# Patient Record
Sex: Female | Born: 1990 | Race: Black or African American | Hispanic: No | Smoking: Never smoker
Health system: Southern US, Community
[De-identification: ages and names within clinical notes are randomized; demographics above are authoritative.]

---

## 2012-06-02 ENCOUNTER — Emergency Department (HOSPITAL_COMMUNITY)
Admission: EM | Admit: 2012-06-02 | Discharge: 2012-06-02 | Disposition: A | Discharge status: Home / Self Care | Payer: Self-pay | Attending: Emergency Medicine | Admitting: Emergency Medicine

## 2012-06-02 ENCOUNTER — Encounter (HOSPITAL_COMMUNITY): Payer: Self-pay | Admitting: *Deleted

## 2012-06-02 DIAGNOSIS — R102 Pelvic and perineal pain: Secondary | ICD-10-CM

## 2012-06-02 DIAGNOSIS — Z3202 Encounter for pregnancy test, result negative: Secondary | ICD-10-CM | POA: Insufficient documentation

## 2012-06-02 DIAGNOSIS — R109 Unspecified abdominal pain: Secondary | ICD-10-CM | POA: Insufficient documentation

## 2012-06-02 LAB — URINALYSIS, ROUTINE W REFLEX MICROSCOPIC
Nitrite: NEGATIVE
Specific Gravity, Urine: 1.026 (ref 1.005–1.030)
Urobilinogen, UA: 0.2 mg/dL (ref 0.0–1.0)

## 2012-06-02 LAB — URINE MICROSCOPIC-ADD ON

## 2012-06-02 LAB — POCT PREGNANCY, URINE: Preg Test, Ur: NEGATIVE

## 2012-06-02 MED ORDER — OXYCODONE-ACETAMINOPHEN 5-325 MG PO TABS: 1.0000 | ORAL_TABLET | ORAL | Status: DC | PRN

## 2012-06-02 MED ORDER — OXYCODONE-ACETAMINOPHEN 5-325 MG PO TABS
2.0000 | ORAL_TABLET | Freq: Once | ORAL | Status: AC
  Administered 2012-06-02: 2 via ORAL
  Filled 2012-06-02: qty 2

## 2012-06-02 MED ORDER — LORAZEPAM 1 MG PO TABS
1.0000 mg | ORAL_TABLET | Freq: Once | ORAL | Status: AC
  Administered 2012-06-02: 1 mg via ORAL
  Filled 2012-06-02: qty 1

## 2012-06-02 MED ORDER — NAPROXEN 500 MG PO TABS: 500.0000 mg | ORAL_TABLET | Freq: Two times a day (BID) | ORAL | Status: DC | PRN

## 2012-06-02 NOTE — ED Notes (Signed)
Abd cramping started 2 hours ago, different from cycle cramps

## 2012-06-02 NOTE — ED Notes (Signed)
Pt reports abdominal cramping that started early this am; pt is menstruating at this time, however sts this is not normal cramping for her. Pt reports moderate nausea this am. Pt sts cramping is intermittent.

## 2012-06-02 NOTE — ED Provider Notes (Signed)
History    22 year old female with abdominal claims. Onset around 7:00 this morning. Patient is currently on her period. Feels like her previous cramping with periods, but stronger than typical. No urinary complaints. No back pain. Pain is in the lower abdomen and does not lateralize. Waxes and wanes but does not completely go away. No appreciable exacerbating relieving factors. No history of abdominal or pelvic surgery. No significant past medical history. Patient took 800 mg of ibuprofen prior to arrival with minimal relief.  CSN: [id number]  Arrival date & time 06/02/12  0847   First MD Initiated Contact with Patient 06/02/12 [phone]      Chief Complaint  Patient presents with  . Abdominal Cramping    (Consider location/radiation/quality/duration/timing/severity/associated sxs/prior treatment) HPI  History reviewed. No pertinent past medical history.  History reviewed. No pertinent past surgical history.  No family history on file.  History  Substance Use Topics  . Smoking status: Never Smoker   . Smokeless tobacco: Not on file  . Alcohol Use: Yes    OB History    Grav Para Term Preterm Abortions TAB SAB Ect Mult Living                  Review of Systems  All systems reviewed and negative, other than as noted in HPI.   Allergies  Zithromax  Home Medications   Current Outpatient Rx  Name  Route  Sig  Dispense  Refill  . BIOTIN 5000 MCG PO CAPS   Oral   Take 1 capsule by mouth 2 (two) times daily.         . OMEGA-3 FATTY ACIDS 1000 MG PO CAPS   Oral   Take 1 g by mouth every morning.         . IBUPROFEN 200 MG PO TABS   Oral   Take 800 mg by mouth every 6 (six) hours as needed. For pain           BP 161/86  Pulse 82  Temp 98.6 F (37 C) (Oral)  Resp 20  SpO2 97%  LMP 05/29/2012  Physical Exam  Nursing note and vitals reviewed. Constitutional: She appears well-developed and well-nourished. No distress.  HENT:  Head: Normocephalic and  atraumatic.  Eyes: Conjunctivae normal are normal. Right eye exhibits no discharge. Left eye exhibits no discharge.  Neck: Neck supple.  Cardiovascular: Normal rate, regular rhythm and normal heart sounds.  Exam reveals no gallop and no friction rub.   No murmur heard. Pulmonary/Chest: Effort normal and breath sounds normal. No respiratory distress.  Abdominal: Soft. She exhibits no distension. There is tenderness.       Mild suprapubic tenderness w/o rebound or guarding  Genitourinary:       No cva tenderness  Musculoskeletal: She exhibits no edema and no tenderness.  Neurological: She is alert.  Skin: Skin is warm and dry.  Psychiatric: She has a normal mood and affect. Her behavior is normal. Thought content normal.    ED Course  Procedures (including critical care time)  Labs Reviewed  URINALYSIS, ROUTINE W REFLEX MICROSCOPIC - Abnormal; Notable for the following:    APPearance CLOUDY (*)     Hgb urine dipstick LARGE (*)     All other components within normal limits  POCT PREGNANCY, URINE  URINE MICROSCOPIC-ADD ON  LAB REPORT - SCANNED   No results found.   1. Pelvic cramping       MDM  21yF with cramps. Suspect related to menstruation.  Only mild tenderness on exam. Afebrile and well appearing. Better with meds. Not pregnant and UA not particularly suggestive of UTI. Feel safe for DC. Emergent precautions discussed.         Raeford Razor, MD 06/06/12 [phone]

## 2012-06-02 NOTE — ED Notes (Signed)
Pt unable to void at this time. 

## 2012-06-25 ENCOUNTER — Emergency Department (HOSPITAL_COMMUNITY): Payer: Self-pay

## 2012-06-25 ENCOUNTER — Encounter (HOSPITAL_COMMUNITY): Payer: Self-pay | Admitting: Emergency Medicine

## 2012-06-25 ENCOUNTER — Emergency Department (HOSPITAL_COMMUNITY)
Admission: EM | Admit: 2012-06-25 | Discharge: 2012-06-25 | Disposition: A | Discharge status: Home / Self Care | Payer: Self-pay | Attending: Emergency Medicine | Admitting: Emergency Medicine

## 2012-06-25 DIAGNOSIS — R059 Cough, unspecified: Secondary | ICD-10-CM | POA: Insufficient documentation

## 2012-06-25 DIAGNOSIS — Z79899 Other long term (current) drug therapy: Secondary | ICD-10-CM | POA: Insufficient documentation

## 2012-06-25 DIAGNOSIS — IMO0001 Reserved for inherently not codable concepts without codable children: Secondary | ICD-10-CM | POA: Insufficient documentation

## 2012-06-25 DIAGNOSIS — R05 Cough: Secondary | ICD-10-CM | POA: Insufficient documentation

## 2012-06-25 DIAGNOSIS — R509 Fever, unspecified: Secondary | ICD-10-CM | POA: Insufficient documentation

## 2012-06-25 DIAGNOSIS — R110 Nausea: Secondary | ICD-10-CM | POA: Insufficient documentation

## 2012-06-25 MED ORDER — ONDANSETRON 4 MG PO TBDP
4.0000 mg | ORAL_TABLET | Freq: Once | ORAL | Status: AC
  Administered 2012-06-25: 4 mg via ORAL
  Filled 2012-06-25: qty 1

## 2012-06-25 MED ORDER — ACETAMINOPHEN 325 MG PO TABS
650.0000 mg | ORAL_TABLET | Freq: Once | ORAL | Status: AC
  Administered 2012-06-25: 650 mg via ORAL
  Filled 2012-06-25: qty 2

## 2012-06-25 MED ORDER — HYDROCODONE-ACETAMINOPHEN 7.5-325 MG/15ML PO SOLN: 15.0000 mL | Freq: Four times a day (QID) | ORAL | Status: DC | PRN

## 2012-06-25 MED ORDER — PROMETHAZINE HCL 25 MG PO TABS: 25.0000 mg | ORAL_TABLET | Freq: Four times a day (QID) | ORAL | Status: DC | PRN

## 2012-06-25 MED ORDER — HYDROCOD POLST-CHLORPHEN POLST 10-8 MG/5ML PO LQCR
5.0000 mL | Freq: Once | ORAL | Status: AC
  Administered 2012-06-25: 5 mL via ORAL
  Filled 2012-06-25: qty 5

## 2012-06-25 NOTE — ED Notes (Signed)
States that she started feeling weak with body aches yesterday. States that she has had a cough for a while.

## 2012-06-25 NOTE — ED Provider Notes (Signed)
History     CSN: [id number]  Arrival date & time 06/25/12  1258   First MD Initiated Contact with Patient 06/25/12 1341      Chief Complaint  Patient presents with  . Fever    (Consider location/radiation/quality/duration/timing/severity/associated sxs/prior treatment) HPI  Margarit Minshall is a 22 y.o. female complaining of myalgia, nausea no vomiting, fever with maximum recorded temperature of 102 yesterday associated with cough that was productive and is now dry and resolve pharyngitis. Patient denies sick contacts, abdominal pain, change in bowel or bladder habits, shortness of breath, chest pain, headache, meningismus. Patient has not had a flu shot this year.   History reviewed. No pertinent past medical history.  History reviewed. No pertinent past surgical history.  No family history on file.  History  Substance Use Topics  . Smoking status: Never Smoker   . Smokeless tobacco: Not on file  . Alcohol Use: Yes    OB History   Grav Para Term Preterm Abortions TAB SAB Ect Mult Living                  Review of Systems  Constitutional: Positive for fever.  Respiratory: Positive for cough. Negative for shortness of breath.   Cardiovascular: Negative for chest pain.  Gastrointestinal: Positive for nausea. Negative for vomiting, abdominal pain and diarrhea.  Musculoskeletal: Positive for myalgias.  All other systems reviewed and are negative.    Allergies  Zithromax  Home Medications   Current Outpatient Rx  Name  Route  Sig  Dispense  Refill  . Biotin 5000 MCG CAPS   Oral   Take 2 capsules by mouth daily.          . fish oil-omega-3 fatty acids 1000 MG capsule   Oral   Take 1 g by mouth every morning.         Marland Kitchen ibuprofen (ADVIL,MOTRIN) 200 MG tablet   Oral   Take 400-800 mg by mouth every 6 (six) hours as needed for pain. For pain           BP 136/69  Pulse 120  Temp(Src) 101.8 F (38.8 C) (Oral)  Resp 19  SpO2 100%  LMP  05/29/2012  Physical Exam  Nursing note and vitals reviewed. Constitutional: She is oriented to person, place, and time. She appears well-developed and well-nourished. No distress.  Appears acutely uncomfortable  HENT:  Head: Normocephalic.  Right Ear: External ear normal.  Left Ear: External ear normal.  Mouth/Throat: Oropharynx is clear and moist.  Eyes: Conjunctivae and EOM are normal. Pupils are equal, round, and reactive to light.  Neck: Normal range of motion. Neck supple.  Cardiovascular: Regular rhythm, normal heart sounds and intact distal pulses.   Tachy  Pulmonary/Chest: Effort normal and breath sounds normal. No stridor. No respiratory distress. She has no wheezes. She has no rales.  Abdominal: Soft. There is no tenderness. There is no rebound and no guarding.  Musculoskeletal: Normal range of motion.  Neurological: She is alert and oriented to person, place, and time.  Psychiatric: She has a normal mood and affect.    ED Course  Procedures (including critical care time)  Labs Reviewed - No data to display Dg Chest 2 View  06/25/2012  *RADIOLOGY REPORT*  Clinical Data: Cough, fever  CHEST - 2 VIEW  Comparison: None.  Findings: Cardiomediastinal silhouette is unremarkable.  No acute infiltrate or pleural effusion.  No pulmonary edema.  IMPRESSION: No active disease.   Original Report Authenticated By: Lang Snow  Pop, M.D.      1. Influenza-like illness       MDM  Patient appears acutely uncomfortable. Symptoms consistent with influenza. Chest x-ray ordered to rule out complication of pneumonia.  Explained to the patient that she has SIRS and is pre-septic. I advised her that although she is young and healthy influenza can be fatal. Advised both her and her friend to have a very low threshold to return to the emergency room for any worsening of symptoms or concerning symptoms.   Pt verbalized understanding and agrees with care plan. Outpatient follow-up and return  precautions given.    New Prescriptions   HYDROCODONE-ACETAMINOPHEN (HYCET) 7.5-325 MG/15 ML SOLUTION    Take 15 mLs by mouth every 6 (six) hours as needed for pain or cough.   PROMETHAZINE (PHENERGAN) 25 MG TABLET    Take 1 tablet (25 mg total) by mouth every 6 (six) hours as needed for nausea.        Wynetta Emery, PA-C 06/25/12 1426  Madelyne Millikan, PA-C 06/25/12 1427

## 2012-06-25 NOTE — ED Provider Notes (Signed)
Medical screening examination/treatment/procedure(s) were performed by non-physician practitioner and as supervising physician I was immediately available for consultation/collaboration.    Janet Decesare R Elantra Caprara, MD 06/25/12 1628 

## 2014-05-03 ENCOUNTER — Encounter (HOSPITAL_COMMUNITY): Payer: Self-pay | Admitting: Emergency Medicine

## 2014-05-03 ENCOUNTER — Emergency Department (HOSPITAL_COMMUNITY)
Admission: EM | Admit: 2014-05-03 | Discharge: 2014-05-03 | Disposition: A | Discharge status: Home / Self Care | Payer: BC Managed Care – PPO | Attending: Emergency Medicine | Admitting: Emergency Medicine

## 2014-05-03 DIAGNOSIS — R05 Cough: Secondary | ICD-10-CM | POA: Insufficient documentation

## 2014-05-03 DIAGNOSIS — R509 Fever, unspecified: Secondary | ICD-10-CM | POA: Insufficient documentation

## 2014-05-03 DIAGNOSIS — Z79899 Other long term (current) drug therapy: Secondary | ICD-10-CM | POA: Diagnosis not present

## 2014-05-03 DIAGNOSIS — H6121 Impacted cerumen, right ear: Secondary | ICD-10-CM

## 2014-05-03 DIAGNOSIS — H9201 Otalgia, right ear: Secondary | ICD-10-CM | POA: Diagnosis present

## 2014-05-03 MED ORDER — AMOXICILLIN 500 MG PO CAPS: 500.0000 mg | ORAL_CAPSULE | Freq: Three times a day (TID) | ORAL | Status: DC

## 2014-05-03 MED ORDER — LIDOCAINE VISCOUS 2 % MT SOLN
15.0000 mL | Freq: Once | OROMUCOSAL | Status: AC
  Administered 2014-05-03: 15 mL via OROMUCOSAL
  Filled 2014-05-03: qty 15

## 2014-05-03 MED ORDER — ACETAMINOPHEN 325 MG PO TABS
650.0000 mg | ORAL_TABLET | Freq: Four times a day (QID) | ORAL | Status: DC | PRN
Start: 2014-05-03 — End: 2014-05-03
  Administered 2014-05-03: 650 mg via ORAL
  Filled 2014-05-03: qty 2

## 2014-05-03 NOTE — ED Notes (Signed)
Pt arrived to the ED with a complaint of a right sided ear ache.  Pt has had a cold for several days and today developed aright sided ear pain that has progressed in intensity.

## 2014-05-03 NOTE — ED Provider Notes (Signed)
CSN: [id number]     Arrival date & time 05/03/14  0343 History   First MD Initiated Contact with Patient 05/03/14 [phone]     Chief Complaint  Patient presents with  . Otalgia      HPI Patient presents complaining of right-sided earache over the past several days.  She states cough without sore throat.  No shortness of breath.  No neck pain or neck swelling.  No rash.  Patient reports subjective fever at home.  Patient has a history of intermittent cerumen impactions.  She states "eardrops" replacement her ear this morning by a friend.  She states increasing pain since then.  She does not know the details of these eardrops.   History reviewed. No pertinent past medical history. History reviewed. No pertinent past surgical history. History reviewed. No pertinent family history. History  Substance Use Topics  . Smoking status: Never Smoker   . Smokeless tobacco: Not on file  . Alcohol Use: Yes   OB History    No data available     Review of Systems  All other systems reviewed and are negative.     Allergies  Zithromax  Home Medications   Prior to Admission medications   Medication Sig Start Date End Date Taking? Authorizing Provider  acetaminophen (TYLENOL) 325 MG tablet Take 650 mg by mouth every 6 (six) hours as needed for moderate pain.   Yes Historical Provider, MD  guaiFENesin (MUCINEX) 600 MG 12 hr tablet Take 600 mg by mouth 2 (two) times daily as needed for cough.   Yes Historical Provider, MD  ibuprofen (ADVIL,MOTRIN) 200 MG tablet Take 400-800 mg by mouth every 6 (six) hours as needed for pain. For pain   Yes Historical Provider, MD  Pseudoeph-Doxylamine-DM-APAP (NYQUIL D COLD/FLU PO) Take 15 mLs by mouth 2 (two) times daily.   Yes Historical Provider, MD         Biotin 5000 MCG CAPS Take 2 capsules by mouth daily.     Historical Provider, MD  fish oil-omega-3 fatty acids 1000 MG capsule Take 1 g by mouth every morning.    Historical Provider, MD   HYDROcodone-acetaminophen (HYCET) 7.5-325 mg/15 ml solution Take 15 mLs by mouth every 6 (six) hours as needed for pain or cough. Patient not taking: Reported on 05/03/2014 06/25/12   Joni ReiningNicole Pisciotta, PA-C  promethazine (PHENERGAN) 25 MG tablet Take 1 tablet (25 mg total) by mouth every 6 (six) hours as needed for nausea. Patient not taking: Reported on 05/03/2014 06/25/12   Joni ReiningNicole Pisciotta, PA-C   BP 126/67 mmHg  Pulse 107  Temp(Src) 102.4 F (39.1 C) (Oral)  Resp 16  Ht 5\' 5"  (1.651 m)  Wt 123 lb (55.792 kg)  BMI 20.47 kg/m2  SpO2 100%  LMP 05/03/2014 (Exact Date) Physical Exam  Constitutional: She is oriented to person, place, and time. She appears well-developed and well-nourished.  HENT:  Head: Normocephalic.  Left TM is normal.  Right TM initially obscured by cerumen impaction.  Cerumen was removed and there is erythema of the TM without obvious perforation.  No signs of otitis externa.  No tenderness of the right mastoid.  Posterior pharynx is normal without tonsillar exudate.  Dentition is without acute abnormality  Eyes: EOM are normal.  Neck: Normal range of motion.  Pulmonary/Chest: Effort normal.  Abdominal: She exhibits no distension.  Musculoskeletal: Normal range of motion.  Neurological: She is alert and oriented to person, place, and time.  Psychiatric: She has a normal mood and  affect.  Nursing note and vitals reviewed.   ED Course  Procedures (including critical care time) Labs Review Labs Reviewed - No data to display  Imaging Review No results found.   EKG Interpretation None      MDM   Final diagnoses:  Otalgia, right  Cerumen impaction, right  Fever, unspecified fever cause    Cerumen impaction with likely developing right otitis media.  Cerumen removed by nursing staff.  Patient is feeling better after cerumen impaction.  Given her fever and her erythematous TM she'll be started on amoxicillin.    Lyanne CoKevin M Harvey Lingo, MD 05/03/14 [phone]

## 2014-05-03 NOTE — Discharge Instructions (Signed)
Cerumen Impaction °A cerumen impaction is when the wax in your ear forms a plug. This plug usually causes reduced hearing. Sometimes it also causes an earache or dizziness. Removing a cerumen impaction can be difficult and painful. The wax sticks to the ear canal. The canal is sensitive and bleeds easily. If you try to remove a heavy wax buildup with a cotton tipped swab, you may push it in further. °Irrigation with water, suction, and small ear curettes may be used to clear out the wax. If the impaction is fixed to the skin in the ear canal, ear drops may be needed for a few days to loosen the wax. People who build up a lot of wax frequently can use ear wax removal products available in your local drugstore. °SEEK MEDICAL CARE IF:  °You develop an earache, increased hearing loss, or marked dizziness. °Document Released: 06/01/2004 Document Revised: 07/17/2011 Document Reviewed: 07/22/2009 °[employer]® Patient Information ©2015 [employer]. This information is not intended to replace advice given to you by your health care provider. Make sure you discuss any questions you have with your health care provider. ° °

## 2014-05-03 NOTE — ED Notes (Signed)
Irrigated R ear with warm saline which returned a large amount of dark brown pieces. Pt tolerated procedure well. Dr Patria Maneampos at bedside to complete wax removal

## 2014-11-02 ENCOUNTER — Encounter (HOSPITAL_COMMUNITY): Payer: Self-pay

## 2014-11-02 ENCOUNTER — Emergency Department (HOSPITAL_COMMUNITY)
Admission: EM | Admit: 2014-11-02 | Discharge: 2014-11-02 | Disposition: A | Discharge status: Home / Self Care | Payer: BLUE CROSS/BLUE SHIELD | Attending: Emergency Medicine | Admitting: Emergency Medicine

## 2014-11-02 DIAGNOSIS — J0100 Acute maxillary sinusitis, unspecified: Secondary | ICD-10-CM | POA: Insufficient documentation

## 2014-11-02 DIAGNOSIS — H9209 Otalgia, unspecified ear: Secondary | ICD-10-CM | POA: Insufficient documentation

## 2014-11-02 DIAGNOSIS — R0981 Nasal congestion: Secondary | ICD-10-CM | POA: Diagnosis present

## 2014-11-02 DIAGNOSIS — Z79899 Other long term (current) drug therapy: Secondary | ICD-10-CM | POA: Diagnosis not present

## 2014-11-02 DIAGNOSIS — R000 Tachycardia, unspecified: Secondary | ICD-10-CM | POA: Diagnosis not present

## 2014-11-02 MED ORDER — GUAIFENESIN ER 600 MG PO TB12: 600.0000 mg | ORAL_TABLET | Freq: Two times a day (BID) | ORAL | Status: DC | PRN

## 2014-11-02 MED ORDER — AMOXICILLIN-POT CLAVULANATE 875-125 MG PO TABS: 1.0000 | ORAL_TABLET | Freq: Two times a day (BID) | ORAL | Status: DC

## 2014-11-02 NOTE — ED Notes (Addendum)
Pt c/o of cold like symptoms and ear pain since last Saturday with no improvement. Denies  N/V/D

## 2014-11-02 NOTE — ED Provider Notes (Signed)
CSN: [id number]     Arrival date & time 11/02/14  1522 History  This chart was scribed for non-physician practitioner Fayrene Helper, PA-C working with Mancel Bale, MD by Murriel Hopper, ED Scribe. This patient was seen in room WTR5/WTR5 and the patient's care was started at 3:31 PM.    Chief Complaint  Patient presents with  . Nasal Congestion  . Otalgia      The history is provided by the patient. No language interpreter was used.     HPI Comments: Michelle Cortez is a 24 y.o. female who presents to the Emergency Department complaining of intermittent worsening fever up to 101 with associated ear pain, sore throat, sneezing, coughing that has been present for the past week. Sts she has had sinus congestion and sinus pain with greenish nasal discharge.  Report sinus headache.  Pt states she has not been able to work because of her symptoms and notes that she was sent home because of her symptoms today. Pt notes she has used Catering manager and tylenol with no relief. 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 Denies vomiting nausea diarrhea.    No past medical history on file. No past surgical history on file. No family history on file. History  Substance Use Topics  . Smoking status: Never Smoker   . Smokeless tobacco: Not on file  . Alcohol Use: Yes   OB History    No data available     Review of Systems  HENT: Positive for ear pain, sneezing and sore throat.   Respiratory: Positive for cough.   Gastrointestinal: Negative for nausea, vomiting and diarrhea.      Allergies  Zithromax  Home Medications   Prior to Admission medications   Medication Sig Start Date End Date Taking? Authorizing Provider  acetaminophen (TYLENOL) 325 MG tablet Take 650 mg by mouth every 6 (six) hours as needed for moderate pain.    Historical Provider, MD  amoxicillin (AMOXIL) 500 MG capsule Take 1 capsule (500 mg total) by mouth 3 (three) times daily. 05/03/14   Azalia Bilis,  MD  Biotin 5000 MCG CAPS Take 2 capsules by mouth daily.     Historical Provider, MD  fish oil-omega-3 fatty acids 1000 MG capsule Take 1 g by mouth every morning.    Historical Provider, MD  guaiFENesin (MUCINEX) 600 MG 12 hr tablet Take 600 mg by mouth 2 (two) times daily as needed for cough.    Historical Provider, MD  HYDROcodone-acetaminophen (HYCET) 7.5-325 mg/15 ml solution Take 15 mLs by mouth every 6 (six) hours as needed for pain or cough. Patient not taking: Reported on 05/03/2014 06/25/12   Joni Reining Pisciotta, PA-C  ibuprofen (ADVIL,MOTRIN) 200 MG tablet Take 400-800 mg by mouth every 6 (six) hours as needed for pain. For pain    Historical Provider, MD  promethazine (PHENERGAN) 25 MG tablet Take 1 tablet (25 mg total) by mouth every 6 (six) hours as needed for nausea. Patient not taking: Reported on 05/03/2014 06/25/12   Joni Reining Pisciotta, PA-C  Pseudoeph-Doxylamine-DM-APAP (NYQUIL D COLD/FLU PO) Take 15 mLs by mouth 2 (two) times daily.    Historical Provider, MD   There were no vitals taken for this visit. Physical Exam  Constitutional: She is oriented to person, place, and time. She appears well-developed and well-nourished.  HENT:  Head: Normocephalic and atraumatic.  Throat:  Rhinorrhea  Uvula midline No tonsil enlargement or exudate Green mucous  Lymphadenopathy No nuchal rigidity  Cardiovascular: Normal rate.  Exam reveals no gallop and no friction rub.   No murmur heard. Tachycardic   Pulmonary/Chest: Effort normal and breath sounds normal. She has no wheezes. She has no rales. She exhibits no tenderness.  Abdominal: Soft. She exhibits no distension. There is no tenderness.  Neurological: She is alert and oriented to person, place, and time.  Skin: Skin is warm and dry.  Psychiatric: She has a normal mood and affect.  Nursing note and vitals reviewed.   ED Course  Procedures (including critical care time)  DIAGNOSTIC STUDIES: Oxygen Saturation is 100% on room  air, normal by my interpretation.    COORDINATION OF CARE: 3:35 PM Discussed treatment plan with pt at bedside and pt agreed to plan. Pt will be treated for sinusitis and prescribed antibiotics.   Patient presents with symptoms suggestive of upper respiratory infection. However she also complaining of sinus headache, sinus congestion and having greenish discharge for more than a week.   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  She has potential to develop sinusitis.  Will prescribe augmentin and recommend pt to continue using OTC medication for sxs treatment.    Labs Review Labs Reviewed - No data to display  Imaging Review No results found.   EKG Interpretation None      MDM   Final diagnoses:  Acute maxillary sinusitis, recurrence not specified    BP 122/76 mmHg  Pulse 106  Temp(Src) 98.5 F (36.9 C) (Oral)  Resp 18  Ht  (1.651 m)  Wt 127 lb (57.607 kg)  BMI 21.13 kg/m2  SpO2 100%   I personally performed the services described in this documentation, which was scribed in my presence. The recorded information has been reviewed and is accurate.     Fayrene Helper, PA-C 11/02/14 1544  Mancel Bale, MD 11/02/14 [phone]

## 2014-11-02 NOTE — Discharge Instructions (Signed)

## 2014-11-02 NOTE — ED Notes (Signed)
edpa bowie present at bedside.

## 2014-12-06 ENCOUNTER — Emergency Department (INDEPENDENT_AMBULATORY_CARE_PROVIDER_SITE_OTHER)
Admission: EM | Admit: 2014-12-06 | Discharge: 2014-12-06 | Disposition: A | Discharge status: Home / Self Care | Payer: Self-pay | Source: Home / Self Care | Attending: Family Medicine | Admitting: Family Medicine

## 2014-12-06 ENCOUNTER — Encounter (HOSPITAL_COMMUNITY): Payer: Self-pay | Admitting: Emergency Medicine

## 2014-12-06 DIAGNOSIS — R51 Headache: Secondary | ICD-10-CM

## 2014-12-06 DIAGNOSIS — R519 Headache, unspecified: Secondary | ICD-10-CM

## 2014-12-06 MED ORDER — IBUPROFEN 400 MG PO TABS: 400.0000 mg | ORAL_TABLET | Freq: Four times a day (QID) | ORAL | Status: DC | PRN
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
## 2014-12-06 NOTE — ED Notes (Signed)
Accessed record for patient phone call .  Looking for wallet, no wallet found
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
## 2015-03-12 ENCOUNTER — Encounter (HOSPITAL_COMMUNITY): Payer: Self-pay | Admitting: Emergency Medicine

## 2015-03-12 ENCOUNTER — Emergency Department (HOSPITAL_COMMUNITY)
Admission: EM | Admit: 2015-03-12 | Discharge: 2015-03-12 | Discharge status: Against Medical Advice | Payer: BLUE CROSS/BLUE SHIELD | Attending: Emergency Medicine | Admitting: Emergency Medicine

## 2015-03-12 DIAGNOSIS — N946 Dysmenorrhea, unspecified: Secondary | ICD-10-CM | POA: Diagnosis not present

## 2015-03-12 DIAGNOSIS — R1110 Vomiting, unspecified: Secondary | ICD-10-CM | POA: Diagnosis present

## 2015-03-12 NOTE — ED Notes (Signed)
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

## 2015-03-12 NOTE — ED Provider Notes (Signed)
CSN: [id number]     Arrival date & time 03/12/15  0751 History   First MD Initiated Contact with Patient 03/12/15 [phone]     Chief Complaint  Patient presents with  . cramps/vomiting      (Consider location/radiation/quality/duration/timing/severity/associated sxs/prior Treatment) Patient is a 24 y.o. female presenting with abdominal pain. The history is provided by the patient (Patient complains of uterine cramps today. She has a history of moderate to severe menses. Patient usually takes Motrin for this.).  Abdominal Pain Pain location:  Suprapubic Pain quality: aching   Pain radiates to:  Does not radiate Pain severity:  Mild Onset quality:  Sudden Timing:  Intermittent Progression:  Waxing and waning Associated symptoms: no chest pain, no cough, no diarrhea, no fatigue and no hematuria     History reviewed. No pertinent past medical history. History reviewed. No pertinent past surgical history. No family history on file. Social History  Substance Use Topics  . Smoking status: Never Smoker   . Smokeless tobacco: None  . Alcohol Use: Yes   OB History    No data available     Review of Systems  Constitutional: Negative for appetite change and fatigue.  HENT: Negative for congestion, ear discharge and sinus pressure.   Eyes: Negative for discharge.  Respiratory: Negative for cough.   Cardiovascular: Negative for chest pain.  Gastrointestinal: Positive for abdominal pain. Negative for diarrhea.  Genitourinary: Negative for frequency and hematuria.       Uterine cramps  Musculoskeletal: Negative for back pain.  Skin: Negative for rash.  Neurological: Negative for seizures and headaches.  Psychiatric/Behavioral: Negative for hallucinations.      Allergies  Zithromax  Home Medications   Prior to Admission medications   Medication Sig Start Date End Date Taking? Authorizing Provider  acetaminophen (TYLENOL) 325 MG tablet Take 650 mg by mouth every 6 (six) hours as  needed for moderate pain.   Yes Historical Provider, MD  ibuprofen (ADVIL,MOTRIN) 400 MG tablet Take 1 tablet (400 mg total) by mouth every 6 (six) hours as needed. Patient taking differently: Take 400 mg by mouth every 6 (six) hours as needed for fever, headache, mild pain or moderate pain.  12/06/14  Yes Doreene Eland, MD  amoxicillin-clavulanate (AUGMENTIN) 875-125 MG per tablet Take 1 tablet by mouth 2 (two) times daily. One po bid x 7 days Patient not taking: Reported on 12/06/2014 11/02/14   Fayrene Helper, PA-C  guaiFENesin (MUCINEX) 600 MG 12 hr tablet Take 1 tablet (600 mg total) by mouth 2 (two) times daily as needed for cough or to loosen phlegm. Patient not taking: Reported on 03/12/2015 11/02/14   Fayrene Helper, PA-C  HYDROcodone-acetaminophen (HYCET) 7.5-325 mg/15 ml solution Take 15 mLs by mouth every 6 (six) hours as needed for pain or cough. Patient not taking: Reported on 05/03/2014 06/25/12   Joni Reining Pisciotta, PA-C  promethazine (PHENERGAN) 25 MG tablet Take 1 tablet (25 mg total) by mouth every 6 (six) hours as needed for nausea. Patient not taking: Reported on 05/03/2014 06/25/12   Joni Reining Pisciotta, PA-C   BP 136/74 mmHg  Pulse 98  Temp(Src) 98.1 F (36.7 C) (Oral)  Resp 16  Ht  (1.651 m)  Wt 133 lb (60.328 kg)  BMI 22.13 kg/m2  SpO2 100%  LMP 03/12/2015 Physical Exam  Constitutional: She is oriented to person, place, and time. She appears well-developed.  HENT:  Head: Normocephalic.  Eyes: Conjunctivae and EOM are normal. No scleral icterus.  Neck: Neck supple.  No thyromegaly present.  Cardiovascular: Normal rate and regular rhythm.  Exam reveals no gallop and no friction rub.   No murmur heard. Pulmonary/Chest: No stridor. She has no wheezes. She has no rales. She exhibits no tenderness.  Abdominal: She exhibits no distension. There is no tenderness. There is no rebound.  Musculoskeletal: Normal range of motion. She exhibits no edema.  Lymphadenopathy:    She has no  cervical adenopathy.  Neurological: She is oriented to person, place, and time. She exhibits normal muscle tone. Coordination normal.  Skin: No rash noted. No erythema.  Psychiatric: She has a normal mood and affect. Her behavior is normal.    ED Course  Procedures (including critical care time) Labs Review Labs Reviewed  CBC WITH DIFFERENTIAL/PLATELET  COMPREHENSIVE METABOLIC PANEL  URINALYSIS, ROUTINE W REFLEX MICROSCOPIC (NOT [facility])  PREGNANCY, URINE    Imaging Review No results found. I have personally reviewed and evaluated these images and lab results as part of my medical decision-making.   EKG Interpretation None      MDM   Final diagnoses:  None    Patient left before any labs were drawn. Suspect symptoms were related to menses.    Bethann BerkshireJoseph Deyanira Fesler, MD 03/12/15 [phone]

## 2015-03-12 NOTE — ED Notes (Signed)
Per pt, states menstrual cramps with pain and vomiting since 5 am-states she has history of the same-took 800 mg of Ibuprofen with no relief

## 2015-03-12 NOTE — ED Notes (Signed)
Patient walked out of emergency room stating that she had to go to work. Estell HarpinZammit, MD notified.

## 2016-07-07 ENCOUNTER — Encounter (HOSPITAL_COMMUNITY): Payer: Self-pay | Admitting: Emergency Medicine

## 2016-07-07 DIAGNOSIS — R112 Nausea with vomiting, unspecified: Secondary | ICD-10-CM | POA: Insufficient documentation

## 2016-07-07 DIAGNOSIS — Z5321 Procedure and treatment not carried out due to patient leaving prior to being seen by health care provider: Secondary | ICD-10-CM | POA: Insufficient documentation

## 2016-07-07 MED ORDER — ONDANSETRON 4 MG PO TBDP
4.0000 mg | ORAL_TABLET | Freq: Once | ORAL | Status: AC | PRN
  Administered 2016-07-07: 4 mg via ORAL
  Filled 2016-07-07: qty 1

## 2016-07-07 NOTE — ED Notes (Signed)
Pt refused blood work at this time.

## 2016-07-07 NOTE — ED Triage Notes (Signed)
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

## 2016-07-08 ENCOUNTER — Emergency Department (HOSPITAL_COMMUNITY)
Admission: EM | Admit: 2016-07-08 | Discharge: 2016-07-08 | Disposition: A | Discharge status: Home / Self Care | Payer: BLUE CROSS/BLUE SHIELD | Attending: Emergency Medicine | Admitting: Emergency Medicine

## 2016-07-08 NOTE — ED Notes (Signed)
Called patient to a room and no answer. 

## 2016-07-08 NOTE — ED Notes (Signed)
Called patient to room and no answer. 

## 2016-07-08 NOTE — ED Notes (Signed)
Pt called in waiting room no answer 

## 2017-07-27 ENCOUNTER — Emergency Department (HOSPITAL_COMMUNITY)
Admission: EM | Admit: 2017-07-27 | Discharge: 2017-07-27 | Disposition: A | Discharge status: Home / Self Care | Payer: BLUE CROSS/BLUE SHIELD | Attending: Emergency Medicine | Admitting: Emergency Medicine

## 2017-07-27 ENCOUNTER — Other Ambulatory Visit: Payer: Self-pay

## 2017-07-27 ENCOUNTER — Encounter (HOSPITAL_COMMUNITY): Payer: Self-pay | Admitting: Emergency Medicine

## 2017-07-27 DIAGNOSIS — R102 Pelvic and perineal pain: Secondary | ICD-10-CM | POA: Insufficient documentation

## 2017-07-27 LAB — PREGNANCY, URINE: Preg Test, Ur: NEGATIVE

## 2017-07-27 LAB — I-STAT CHEM 8, ED
BUN: 7 mg/dL (ref 6–20)
CALCIUM ION: 1.19 mmol/L (ref 1.15–1.40)
CHLORIDE: 105 mmol/L (ref 101–111)
Creatinine, Ser: 0.7 mg/dL (ref 0.44–1.00)
Glucose, Bld: 94 mg/dL (ref 65–99)
HCT: 40 % (ref 36.0–46.0)
Hemoglobin: 13.6 g/dL (ref 12.0–15.0)
POTASSIUM: 4.5 mmol/L (ref 3.5–5.1)
SODIUM: 141 mmol/L (ref 135–145)
TCO2: 24 mmol/L (ref 22–32)

## 2017-07-27 LAB — URINALYSIS, ROUTINE W REFLEX MICROSCOPIC
BILIRUBIN URINE: NEGATIVE
Glucose, UA: NEGATIVE mg/dL
Ketones, ur: NEGATIVE mg/dL
LEUKOCYTES UA: NEGATIVE
NITRITE: NEGATIVE
Protein, ur: NEGATIVE mg/dL
SPECIFIC GRAVITY, URINE: 1.017 (ref 1.005–1.030)
pH: 5 (ref 5.0–8.0)

## 2017-07-27 MED ORDER — NAPROXEN 375 MG PO TABS: 375.0000 mg | ORAL_TABLET | Freq: Two times a day (BID) | ORAL | 0 refills | Status: AC

## 2017-07-27 MED ORDER — KETOROLAC TROMETHAMINE 30 MG/ML IJ SOLN
30.0000 mg | Freq: Once | INTRAMUSCULAR | Status: AC
  Administered 2017-07-27: 30 mg via INTRAMUSCULAR
  Filled 2017-07-27: qty 1

## 2017-07-27 NOTE — ED Provider Notes (Signed)
[facility]-EMERGENCY DEPT Provider Note   CSN: [id number] Arrival date & time: 07/27/17  0607     History   Chief Complaint Chief Complaint  Patient presents with  . Menstrual Cramps    HPI Michelle Cortez is a 27 y.o. female.  HPI  Patient is a 27 year old female with a history of heavy menses presenting for lower abdominal cramping patient reports that she started menses yesterday, and her.  Lower abdominal pain is cramping in nature and consistent with her prior episodes of menstrual cramping.  Patient reports that it is the same in quality as her prior episodes of cramping, however it is worse this time.  Patient denies any abnormal vaginal discharge, dysuria, urgency, or frequency.  Patient denies any concerns about pregnancy or attempt to become pregnant.  Patient does not use contraception.  Patient reports she is in a monogamous sexual relationship with her husband.  Patient has any fevers, chills.  Patient reports she is nauseous at times with her cramping, but no emesis.  No diarrhea.  Last vomit yesterday and normal for patient.  Patient denies flank pain.  Lightheadedness, shortness of breath, or chest pain.  Patient has tried Tylenol with minimal relief.  Patient is reporting that on presentation in the emergency department after taking Tylenol, she does feel better at this time.  History reviewed. No pertinent past medical history.  There are no active problems to display for this patient.   History reviewed. No pertinent surgical history.  OB History   None      Home Medications    Prior to Admission medications   Medication Sig Start Date End Date Taking? Authorizing Provider  acetaminophen (TYLENOL) 325 MG tablet Take 975 mg by mouth every 6 (six) hours as needed for moderate pain.    Yes [provider]  ibuprofen (ADVIL,MOTRIN) 400 MG tablet Take 1 tablet (400 mg total) by mouth every 6 (six) hours as needed. Patient taking  differently: Take 400 mg by mouth every 6 (six) hours as needed for fever, headache, mild pain or moderate pain.  12/06/14  Yes Doreene Eland, MD  guaiFENesin (MUCINEX) 600 MG 12 hr tablet Take 1 tablet (600 mg total) by mouth 2 (two) times daily as needed for cough or to loosen phlegm. Patient not taking: Reported on 03/12/2015 11/02/14   Fayrene Helper, PA-C  HYDROcodone-acetaminophen (HYCET) 7.5-325 mg/15 ml solution Take 15 mLs by mouth every 6 (six) hours as needed for pain or cough. Patient not taking: Reported on 05/03/2014 06/25/12   Pisciotta, Joni Reining, PA-C    Family History No family history on file.  Social History Social History   Tobacco Use  . Smoking status: Never Smoker  . Smokeless tobacco: Never Used  Substance Use Topics  . Alcohol use: No  . Drug use: No     Allergies   Zithromax [azithromycin]   Review of Systems Review of Systems  Constitutional: Negative for chills and fever.  Respiratory: Negative for cough and shortness of breath.   Cardiovascular: Negative for chest pain.  Gastrointestinal: Negative for abdominal pain, nausea and vomiting.  Genitourinary: Positive for menstrual problem and pelvic pain. Negative for dysuria, flank pain and vaginal discharge.  Musculoskeletal: Negative for back pain and myalgias.  Neurological: Negative for light-headedness.  All other systems reviewed and are negative.    Physical Exam Updated Vital Signs BP 125/77 (BP Location: Left Arm)   Pulse 78   Temp 98.6 F (37 C) (Oral)  Resp 16   LMP 07/27/2017   SpO2 100%   Physical Exam  Constitutional: She appears well-developed and well-nourished. No distress.  HENT:  Head: Normocephalic and atraumatic.  Mouth/Throat: Oropharynx is clear and moist.  Eyes: Pupils are equal, round, and reactive to light. Conjunctivae and EOM are normal.  Neck: Normal range of motion. Neck supple.  Cardiovascular: Normal rate, regular rhythm, S1 normal and S2 normal.  No murmur  heard. Pulmonary/Chest: Effort normal and breath sounds normal. She has no wheezes. She has no rales.  Abdominal: Soft. She exhibits no distension. There is tenderness. There is no guarding.  Mild tenderness over suprapubic region.  Genitourinary:  Genitourinary Comments: Examination performed with RN chaperone, Morrie Sheldonshley, present.  No external lesions of the vagina or perineum.  No inguinal lymphadenopathy.  No lesions of the vaginal canal.  Cervix without friability or erythema.  No polyps of the cervix. Bimanual exam demonstrates mild uterine fundus tenderness, but no masses or adnexal tenderness.  No cervical motion tenderness.  Musculoskeletal: Normal range of motion. She exhibits no edema or deformity.  Lymphadenopathy:    She has no cervical adenopathy.  Neurological: She is alert.  Cranial nerves grossly intact. Patient moves extremities symmetrically and with good coordination.  Skin: Skin is warm and dry. No rash noted. No erythema.  Psychiatric: She has a normal mood and affect. Her behavior is normal. Judgment and thought content normal.  Nursing note and vitals reviewed.    ED Treatments / Results  Labs (all labs ordered are listed, but only abnormal results are displayed) Labs Reviewed  URINALYSIS, ROUTINE W REFLEX MICROSCOPIC - Abnormal; Notable for the following components:      Result Value   Color, Urine STRAW (*)    Hgb urine dipstick LARGE (*)    Bacteria, UA RARE (*)    Squamous Epithelial / LPF 0-5 (*)    All other components within normal limits  PREGNANCY, URINE  I-STAT CHEM 8, ED    EKG  EKG Interpretation None       Radiology No results found.  Procedures Procedures (including critical care time)  Medications Ordered in ED Medications  ketorolac (TORADOL) 30 MG/ML injection 30 mg (has no administration in time range)     Initial Impression / Assessment and Plan / ED Course  I have reviewed the triage vital signs and the nursing  notes.  Pertinent labs & imaging results that were available during my care of the patient were reviewed by me and considered in my medical decision making (see chart for details).  Clinical Course as of Jul 28 926  Fri Jul 27, 2017  0927 Patient continues to be symptomatic of cramping.  Return precautions given for any fever or chills with abdominal pain, unusual vaginal discharge, worsening pelvic pain, or intractable nausea or vomiting.   [AM]    Clinical Course User Index [AM] Elisha PonderMurray, Zhyon Antenucci B, PA-C    Patient is nontoxic-appearing, afebrile, and in no acute distress.  Patient presenting with pelvic cramping that patient reports is consistent with all prior episodes of menstrual cramping, but worse in nature.  Patient feeling better at this time.  Will check basic labs for hemoglobin as well as urinalysis, urine pregnancy, and pelvic exam.  Recommend follow-up with family medicine and gynecology.  Urinalysis significant for large hemoglobin, and patient is currently menstruating.   No evidence of infection. Urine pregnancy negative. Patient deferred any STI testing today.   Hemoglobin is normal and i-STAT Chem-8 without further  abnormality.  Abdomen is benign, and pelvic exam without abnormality.  Recommend naproxen 3 days before menses, and continued into the first couple days of menses.  Patient directed to follow-up.    Final Clinical Impressions(s) / ED Diagnoses   Final diagnoses:  Pelvic pain    ED Discharge Orders        Ordered    naproxen (NAPROSYN) 375 MG tablet  2 times daily     07/27/17 0930       Delia Chimes 07/27/17 0933    Benjiman Core, MD 07/27/17 1711

## 2017-07-27 NOTE — ED Triage Notes (Addendum)
Pt arriving with severe menstrual cramps. Pt states she normally has painful periods, but today is worse. Pt took 1000mg  Tylenol prior to arrival

## 2017-07-27 NOTE — Discharge Instructions (Addendum)
Please see the information and instructions below regarding your visit.  Your diagnoses today include:  1. Pelvic pain     Tests performed today include: See side panel of your discharge paperwork for testing performed today. Vital signs are listed at the bottom of these instructions.   Urine pregnancy is negative, no signs of infection in the urine, and her hemoglobin is normal.  Medications prescribed:    Take any prescribed medications only as prescribed, and any over the counter medications only as directed on the packaging.  You are prescribed Naproxen, a non-steroidal anti-inflammatory agent (NSAID) for pain. You may take 375 mg every 12 hours as needed for pain. If still requiring this medication around the clock for acute pain after 10 days, please see your primary healthcare provider.  Women who are pregnant, breastfeeding, or planning on becoming pregnant should not take non-steroidal anti-inflammatories such as   You may combine this medication with Tylenol, 650 mg every 6 hours, so you are receiving something for pain every 3 hours.  This is not a long-term medication unless under the care and direction of your primary provider. Taking this medication long-term and not under the supervision of a healthcare provider could increase the risk of stomach ulcers, kidney problems, and cardiovascular problems such as high blood pressure.    Home care instructions:  Please follow any educational materials contained in this packet.   Follow-up instructions: Please follow-up with your primary care provider as soon as possible for further evaluation of your symptoms if they are not completely improved.   [facility] with [facility] as needed for further management.  Return instructions:  Please return to the Emergency Department if you experience worsening symptoms.  Please return for any worsening pelvic pain, unusual vaginal discharge with pelvic pain,  fever or chills, or nausea or vomiting that prevents you from keeping anything down. Please also return if you have menses that are progressively heavier and associated with shortness of breath, chest pain, dizziness upon standing. Please return if you have any other emergent concerns.  Additional Information:   Your vital signs today were: BP 125/77 (BP Location: Left Arm)    Pulse 78    Temp 98.6 F (37 C) (Oral)    Resp 16    LMP 07/27/2017    SpO2 100%  If your blood pressure (BP) was elevated on multiple readings during this visit above 130 for the top number or above 80 for the bottom number, please have this repeated by your primary care provider within one month. --------------  Thank you for allowing us to participate in your care today.

## 2017-07-27 NOTE — ED Notes (Signed)
EDPA Provider at bedside. 

## 2017-10-19 ENCOUNTER — Emergency Department (HOSPITAL_COMMUNITY)
Admission: EM | Admit: 2017-10-19 | Discharge: 2017-10-19 | Discharge status: Against Medical Advice | Payer: Self-pay | Attending: Emergency Medicine | Admitting: Emergency Medicine

## 2017-10-19 ENCOUNTER — Emergency Department (HOSPITAL_COMMUNITY): Payer: Self-pay

## 2017-10-19 ENCOUNTER — Encounter (HOSPITAL_COMMUNITY): Payer: Self-pay

## 2017-10-19 ENCOUNTER — Other Ambulatory Visit: Payer: Self-pay

## 2017-10-19 DIAGNOSIS — O209 Hemorrhage in early pregnancy, unspecified: Secondary | ICD-10-CM | POA: Insufficient documentation

## 2017-10-19 DIAGNOSIS — Z79899 Other long term (current) drug therapy: Secondary | ICD-10-CM | POA: Insufficient documentation

## 2017-10-19 DIAGNOSIS — N939 Abnormal uterine and vaginal bleeding, unspecified: Secondary | ICD-10-CM

## 2017-10-19 DIAGNOSIS — Z3A01 Less than 8 weeks gestation of pregnancy: Secondary | ICD-10-CM | POA: Insufficient documentation

## 2017-10-19 DIAGNOSIS — Z3201 Encounter for pregnancy test, result positive: Secondary | ICD-10-CM | POA: Insufficient documentation

## 2017-10-19 LAB — CBC WITH DIFFERENTIAL/PLATELET
BASOS ABS: 0.1 10*3/uL (ref 0.0–0.1)
Basophils Relative: 1 %
EOS PCT: 1 %
Eosinophils Absolute: 0.1 10*3/uL (ref 0.0–0.7)
HCT: 38.7 % (ref 36.0–46.0)
Hemoglobin: 12.6 g/dL (ref 12.0–15.0)
LYMPHS PCT: 26 %
Lymphs Abs: 2.1 10*3/uL (ref 0.7–4.0)
MCH: 27.3 pg (ref 26.0–34.0)
MCHC: 32.6 g/dL (ref 30.0–36.0)
MCV: 83.8 fL (ref 78.0–100.0)
MONO ABS: 0.5 10*3/uL (ref 0.1–1.0)
Monocytes Relative: 6 %
Neutro Abs: 5.5 10*3/uL (ref 1.7–7.7)
Neutrophils Relative %: 66 %
PLATELETS: 378 10*3/uL (ref 150–400)
RBC: 4.62 MIL/uL (ref 3.87–5.11)
RDW: 15.2 % (ref 11.5–15.5)
WBC: 8.2 10*3/uL (ref 4.0–10.5)

## 2017-10-19 LAB — ABO/RH: ABO/RH(D): O POS

## 2017-10-19 LAB — COMPREHENSIVE METABOLIC PANEL
ALT: 13 U/L — ABNORMAL LOW (ref 14–54)
ANION GAP: 8 (ref 5–15)
AST: 13 U/L — ABNORMAL LOW (ref 15–41)
Albumin: 4.1 g/dL (ref 3.5–5.0)
Alkaline Phosphatase: 43 U/L (ref 38–126)
BUN: 10 mg/dL (ref 6–20)
CHLORIDE: 107 mmol/L (ref 101–111)
CO2: 24 mmol/L (ref 22–32)
Calcium: 9 mg/dL (ref 8.9–10.3)
Creatinine, Ser: 0.57 mg/dL (ref 0.44–1.00)
Glucose, Bld: 88 mg/dL (ref 65–99)
POTASSIUM: 4.1 mmol/L (ref 3.5–5.1)
Sodium: 139 mmol/L (ref 135–145)
Total Bilirubin: 0.4 mg/dL (ref 0.3–1.2)
Total Protein: 7.6 g/dL (ref 6.5–8.1)

## 2017-10-19 LAB — WET PREP, GENITAL
SPERM: NONE SEEN
Trich, Wet Prep: NONE SEEN
Yeast Wet Prep HPF POC: NONE SEEN

## 2017-10-19 LAB — URINALYSIS, ROUTINE W REFLEX MICROSCOPIC
BILIRUBIN URINE: NEGATIVE
Bacteria, UA: NONE SEEN
GLUCOSE, UA: NEGATIVE mg/dL
Ketones, ur: NEGATIVE mg/dL
LEUKOCYTES UA: NEGATIVE
NITRITE: NEGATIVE
PH: 7 (ref 5.0–8.0)
Protein, ur: NEGATIVE mg/dL
Specific Gravity, Urine: 1.005 (ref 1.005–1.030)

## 2017-10-19 LAB — PREGNANCY, URINE: PREG TEST UR: POSITIVE — AB

## 2017-10-19 LAB — HCG, QUANTITATIVE, PREGNANCY: HCG, BETA CHAIN, QUANT, S: 26186 m[IU]/mL — AB (ref <5)

## 2017-10-19 NOTE — ED Triage Notes (Signed)
Patient c/o small amount of vaginal bleeding since last night, but more today. Patient is [redacted] weeks pregnant. Patient denies any unusual or different discomforts.

## 2017-10-19 NOTE — ED Notes (Signed)
Bed: WA04 Expected date:  Expected time:  Means of arrival:  Comments: 

## 2017-10-19 NOTE — ED Notes (Signed)
Went to round on the pt, she was not in the assigned room, I called her and she indicated that she had left after her US exam stating that she "was not pleased that her husband was asked to leave after the exam." I expressed this to the attending provider for a possible elopement.

## 2017-10-19 NOTE — ED Provider Notes (Signed)
[facility]-EMERGENCY DEPT Provider Note   CSN: [id number] Arrival date & time: 10/19/17  1028     History   Chief Complaint Chief Complaint  Patient presents with  . Vaginal Bleeding    [redacted] weeks pregnant    HPI Michelle Cortez is a 27 y.o. female who presents for cc of vaginal bleeding.  Patient is G1, P0. She is estimated at [redacted]w[redacted]d. EDD of 06/21/2017.  Patient states that she was at work today when she noticed some blood coming from her vagina.  She states that since that time she has had some spotting.  She denies abdominal pain or cramping, fevers or chills.  She is here with her husband at bedside.  HPI  History reviewed. No pertinent past medical history.  There are no active problems to display for this patient.   History reviewed. No pertinent surgical history.   OB History    Gravida  1   Para      Term      Preterm      AB      Living        SAB      TAB      Ectopic      Multiple      Live Births               Home Medications    Prior to Admission medications   Medication Sig Start Date End Date Taking? Authorizing Provider  naproxen (NAPROSYN) 375 MG tablet Take 1 tablet (375 mg total) by mouth 2 (two) times daily. Take 2 times daily 3 days before menses begins, and 2 days into your menses. 07/27/17  Yes Aviva Kluver B, PA-C  Prenatal Vit-Fe Fumarate-FA (PRENATAL MULTIVITAMIN) TABS tablet Take 1 tablet by mouth daily at 12 noon.   Yes [provider]  acetaminophen (TYLENOL) 325 MG tablet Take 975 mg by mouth every 6 (six) hours as needed for moderate pain.     [provider]  guaiFENesin (MUCINEX) 600 MG 12 hr tablet Take 1 tablet (600 mg total) by mouth 2 (two) times daily as needed for cough or to loosen phlegm. Patient not taking: Reported on 03/12/2015 11/02/14   Fayrene Helper, PA-C  HYDROcodone-acetaminophen (HYCET) 7.5-325 mg/15 ml solution Take 15 mLs by mouth every 6 (six) hours as needed for pain  or cough. Patient not taking: Reported on 05/03/2014 06/25/12   Pisciotta, Joni Reining, PA-C  ibuprofen (ADVIL,MOTRIN) 400 MG tablet Take 1 tablet (400 mg total) by mouth every 6 (six) hours as needed. Patient not taking: Reported on 10/19/2017 12/06/14   Doreene Eland, MD    Family History History reviewed. No pertinent family history.  Social History Social History   Tobacco Use  . Smoking status: Never Smoker  . Smokeless tobacco: Never Used  Substance Use Topics  . Alcohol use: No  . Drug use: No     Allergies   Zithromax [azithromycin]   Review of Systems Review of Systems Ten systems reviewed and are negative for acute change, except as noted in the HPI.    Physical Exam Updated Vital Signs BP 139/90 (BP Location: Right Arm)   Pulse 88   Temp 98.5 F (36.9 C) (Oral)   Resp 18   Ht 5\' 5"  (1.651 m)   Wt 66.7 kg (147 lb)   LMP 07/27/2017   SpO2 100%   BMI 24.46 kg/m   Physical Exam  Constitutional: She is oriented to person,  place, and time. She appears well-developed and well-nourished. No distress.  HENT:  Head: Normocephalic and atraumatic.  Eyes: Conjunctivae are normal. No scleral icterus.  Neck: Normal range of motion.  Cardiovascular: Normal rate, regular rhythm and normal heart sounds. Exam reveals no gallop and no friction rub.  No murmur heard. Pulmonary/Chest: Effort normal and breath sounds normal. No respiratory distress.  Abdominal: Soft. Bowel sounds are normal. She exhibits no distension and no mass. There is no tenderness. There is no guarding.  Genitourinary:  Genitourinary Comments: Patient with normal external female genitalia, Normal vaginal canal without discharge.  Nulliparous cervical office with Chadwick's sign, minimal bloodstained mucosal discharge.  Cervical loss is closed, no CMT or adnexal tenderness or fullness.  Neurological: She is alert and oriented to person, place, and time.  Skin: Skin is warm and dry. She is not  diaphoretic.  Psychiatric: Her behavior is normal.  Nursing note and vitals reviewed.    ED Treatments / Results  Labs (all labs ordered are listed, but only abnormal results are displayed) Labs Reviewed  COMPREHENSIVE METABOLIC PANEL - Abnormal; Notable for the following components:      Result Value   AST 13 (*)    ALT 13 (*)    All other components within normal limits  URINALYSIS, ROUTINE W REFLEX MICROSCOPIC - Abnormal; Notable for the following components:   Hgb urine dipstick MODERATE (*)    All other components within normal limits  WET PREP, GENITAL  CBC WITH DIFFERENTIAL/PLATELET  HCG, QUANTITATIVE, PREGNANCY  HIV ANTIBODY (ROUTINE TESTING)  RPR  PREGNANCY, URINE  I-STAT BETA HCG BLOOD, ED ([facility], [facility], [facility] ONLY)  ABO/RH  GC/CHLAMYDIA PROBE AMP ([place]) NOT AT [facility]    EKG None  Radiology No results found.  Procedures Procedures (including critical care time)  Medications Ordered in ED Medications - No data to display   Initial Impression / Assessment and Plan / ED Course  I have reviewed the triage vital signs and the nursing notes.  Pertinent labs & imaging results that were available during my care of the patient were reviewed by me and considered in my medical decision making (see chart for details).  Clinical Course as of Oct 20 1318  Fri Oct 19, 2017  1306 ABO/RH(D): O POS Performed at [facility], [address] [place], [place] [address]  [AH]    Clinical Course User Index [AH] Arthor CaptainHarris, Khianna Blazina, PA-C    Patient Eloped from the ED apparently because the US tech told her husband he could not be in the room during during the ultrasound.  Nurse Dairl Ponderscar Njihi try to contact the patient and transfer the line however patient hung up the phone.  There are no emergent findings on her work-up.  Final Clinical Impressions(s) / ED Diagnoses   Final diagnoses:  None    ED Discharge Orders    None       Arthor CaptainHarris, Gerad Cornelio,  PA-C 10/19/17 1645    Donnetta Hutchingook, Brian, MD 10/20/17 [phone]

## 2017-10-20 LAB — GC/CHLAMYDIA PROBE AMP (~~LOC~~) NOT AT ARMC
Chlamydia: POSITIVE — AB
Neisseria Gonorrhea: POSITIVE — AB

## 2017-10-20 LAB — HIV ANTIBODY (ROUTINE TESTING W REFLEX): HIV SCREEN 4TH GENERATION: NONREACTIVE

## 2017-10-20 LAB — RPR: RPR Ser Ql: NONREACTIVE

## 2017-11-23 ENCOUNTER — Emergency Department (HOSPITAL_COMMUNITY)
Admission: EM | Admit: 2017-11-23 | Discharge: 2017-11-23 | Disposition: A | Discharge status: Home / Self Care | Payer: Medicaid Other | Attending: Emergency Medicine | Admitting: Emergency Medicine

## 2017-11-23 ENCOUNTER — Other Ambulatory Visit: Payer: Self-pay

## 2017-11-23 DIAGNOSIS — H9202 Otalgia, left ear: Secondary | ICD-10-CM | POA: Diagnosis present

## 2017-11-23 DIAGNOSIS — H6122 Impacted cerumen, left ear: Secondary | ICD-10-CM | POA: Diagnosis not present

## 2017-11-23 MED ORDER — CARBAMIDE PEROXIDE 6.5 % OT SOLN: 5.0000 [drp] | Freq: Two times a day (BID) | OTIC | 0 refills | Status: AC

## 2017-11-23 NOTE — ED Triage Notes (Signed)
Patient c/o left ear pain x 3 days.

## 2017-11-23 NOTE — ED Provider Notes (Signed)
[facility] EMERGENCY DEPARTMENT Provider Note   CSN: [id number] Arrival date & time: 11/23/17  3546     History   Chief Complaint Chief Complaint  Patient presents with  . Otalgia    left ear    HPI Michelle Cortez is a 27 y.o. female.  The history is provided by the patient. No language interpreter was used.  Otalgia  Pertinent negatives include no ear discharge and no headaches.     27 year old female resenting complaining of left ear pain.  For the past 3 days patient has noticed decreased hearing in her left ear which she felt is related to a wax buildup.  She tries using a wax removal kit without success.  For the past 2 days she now complaining of throbbing pain in her left ear patient worsening with manipulation.  She endorsed decreased hearing.  She denies any tinnitus, jaw pain, dental pain, headache or fever.  No complaint of pain when she lays on the affected side.  She used Q-tips on occasion.  She denies any recent water exposure.  No history of diabetes.  No past medical history on file.  There are no active problems to display for this patient.   No past surgical history on file.   OB History    Gravida  1   Para      Term      Preterm      AB      Living        SAB      TAB      Ectopic      Multiple      Live Births               Home Medications    Prior to Admission medications   Medication Sig Start Date End Date Taking? Authorizing Provider  acetaminophen (TYLENOL) 325 MG tablet Take 975 mg by mouth every 6 (six) hours as needed for moderate pain.     [provider]  guaiFENesin (MUCINEX) 600 MG 12 hr tablet Take 1 tablet (600 mg total) by mouth 2 (two) times daily as needed for cough or to loosen phlegm. Patient not taking: Reported on 03/12/2015 11/02/14   Domenic Moras, PA-C  HYDROcodone-acetaminophen (HYCET) 7.5-325 mg/15 ml solution Take 15 mLs by mouth every 6 (six) hours as needed for pain or  cough. Patient not taking: Reported on 05/03/2014 06/25/12   Pisciotta, Elmyra Ricks, PA-C  ibuprofen (ADVIL,MOTRIN) 400 MG tablet Take 1 tablet (400 mg total) by mouth every 6 (six) hours as needed. Patient not taking: Reported on 10/19/2017 12/06/14   Kinnie Feil, MD  naproxen (NAPROSYN) 375 MG tablet Take 1 tablet (375 mg total) by mouth 2 (two) times daily. Take 2 times daily 3 days before menses begins, and 2 days into your menses. 07/27/17   Albesa Seen, PA-C  Prenatal Vit-Fe Fumarate-FA (PRENATAL MULTIVITAMIN) TABS tablet Take 1 tablet by mouth daily at 12 noon.    [provider]    Family History No family history on file.  Social History Social History   Tobacco Use  . Smoking status: Never Smoker  . Smokeless tobacco: Never Used  Substance Use Topics  . Alcohol use: No  . Drug use: No     Allergies   Zithromax [azithromycin]   Review of Systems Review of Systems  HENT: Positive for ear pain. Negative for ear discharge.   Neurological: Negative for headaches.  Physical Exam Updated Vital Signs BP (!) 134/93 (BP Location: Right Arm)   Pulse 91   Temp 99.1 F (37.3 C) (Oral)   Resp 14   Ht 5' 5"  (1.651 m)   Wt 70.3 kg (155 lb)   LMP 07/27/2017   SpO2 99%   BMI 25.79 kg/m   Physical Exam  Constitutional: She appears well-developed and well-nourished. No distress.  HENT:  Head: Atraumatic.  Ears: Right TM with small amount of cerumen present but TM visible and normal-appearing.  Left ear canal is occluded with cerumen, TM not visualized.  Canal non-edematous, nonerythematous and no tenderness to manipulation of the earlobe.  No evidence of mastoiditis.  Mouth: Normal dentition, no dental pain, no malocclusion or trismus.  Eyes: Conjunctivae are normal.  Neck: Neck supple.  Neurological: She is alert.  Skin: No rash noted.  Psychiatric: She has a normal mood and affect.  Nursing note and vitals reviewed.    ED Treatments / Results    Labs (all labs ordered are listed, but only abnormal results are displayed) Labs Reviewed - No data to display  EKG None  Radiology No results found.  Procedures Procedures (including critical care time)  Medications Ordered in ED Medications - No data to display   Initial Impression / Assessment and Plan / ED Course  I have reviewed the triage vital signs and the nursing notes.  Pertinent labs & imaging results that were available during my care of the patient were reviewed by me and considered in my medical decision making (see chart for details).     BP (!) 134/93 (BP Location: Right Arm)   Pulse 91   Temp 99.1 F (37.3 C) (Oral)   Resp 14   Ht 5' 5"  (1.651 m)   Wt 70.3 kg (155 lb)   LMP 07/27/2017   SpO2 99%   BMI 25.79 kg/m    Final Clinical Impressions(s) / ED Diagnoses   Final diagnoses:  Impacted cerumen of left ear    ED Discharge Orders        Ordered    carbamide peroxide (DEBROX) 6.5 % OTIC solution  2 times daily     11/23/17 1000     8:01 AM Patient here with left ear pain and decreased hearing.  It appears she has cerumen impaction affecting her with her hearing.  Will perform ear irrigation for symptomatic relief.  10:01 AM Left ear was irrigated by nursing staff.  Complete removal of cerumen impaction.  Patient felt much better.  On reexamination, normal TM without signs of perforation.  Hearing is intact.  Patient stable for discharge.  Will prescribe Debrox.   Domenic Moras, PA-C 11/23/17 1001    Lajean Saver, MD 11/23/17 1455

## 2017-11-23 NOTE — ED Notes (Signed)
ED Provider at bedside. 

## 2019-03-18 IMAGING — US US OB TRANSVAGINAL
1 series · 14 of 28 positions shown · non-contrast
Comparison: None.

CLINICAL DATA: Pregnant patient with vaginal bleeding.

EXAM:
OBSTETRIC <14 WK US AND TRANSVAGINAL OB US
TECHNIQUE: Both transabdominal and transvaginal ultrasound examinations were
performed for complete evaluation of the gestation as well as the
maternal uterus, adnexal regions, and pelvic cul-de-sac.
Transvaginal technique was performed to assess early pregnancy.

[Series 1: us ob transvaginal · 0.14mm/px · 14 of 82 slices shown]
[im 4/82]
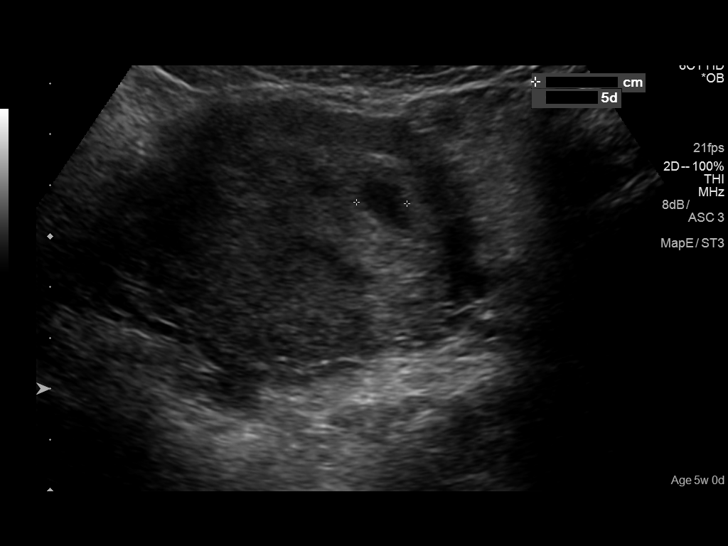
[im 10/82]
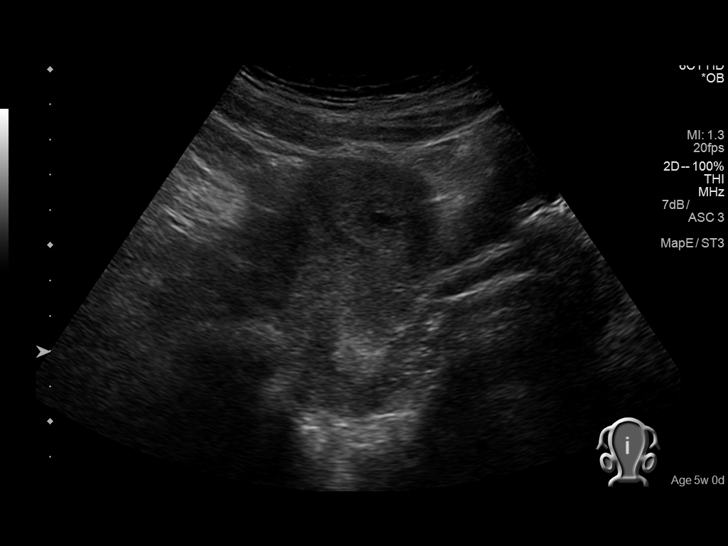
[im 16/82]
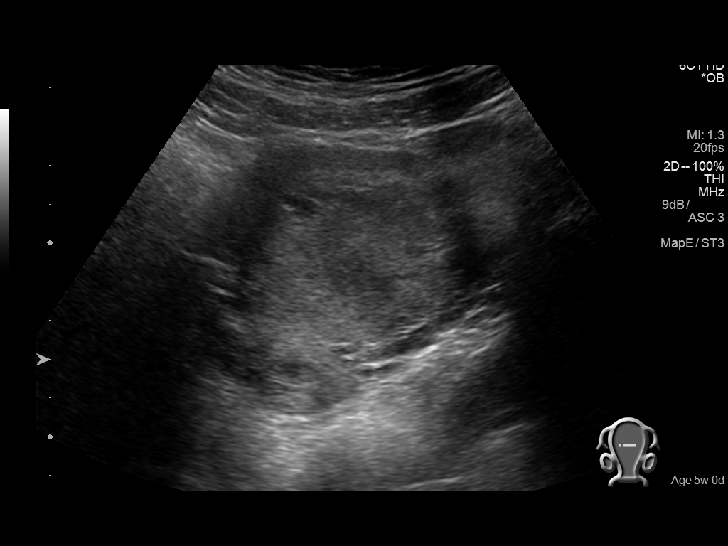
[im 22/82]
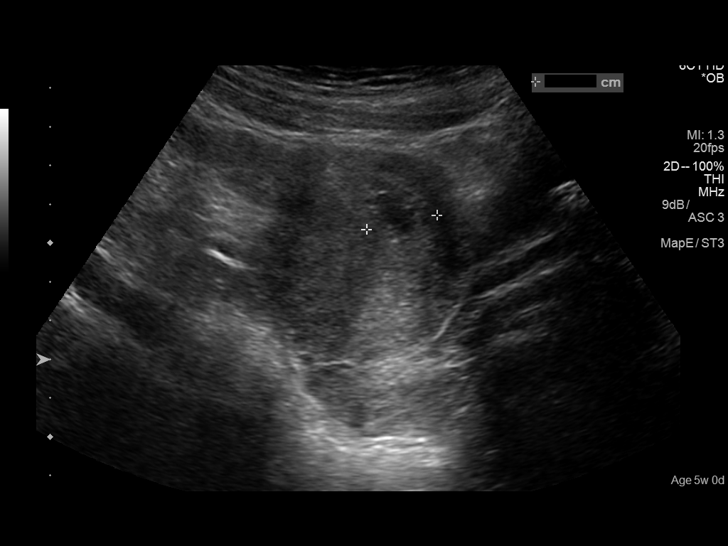
[im 28/82]
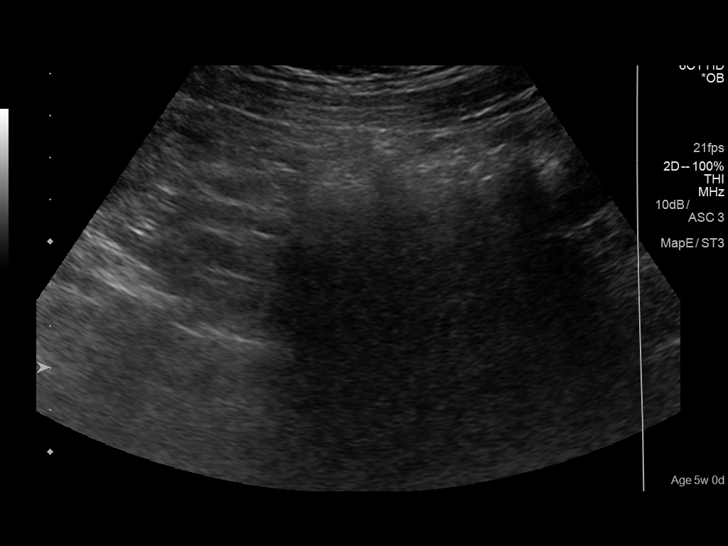
[im 34/82]
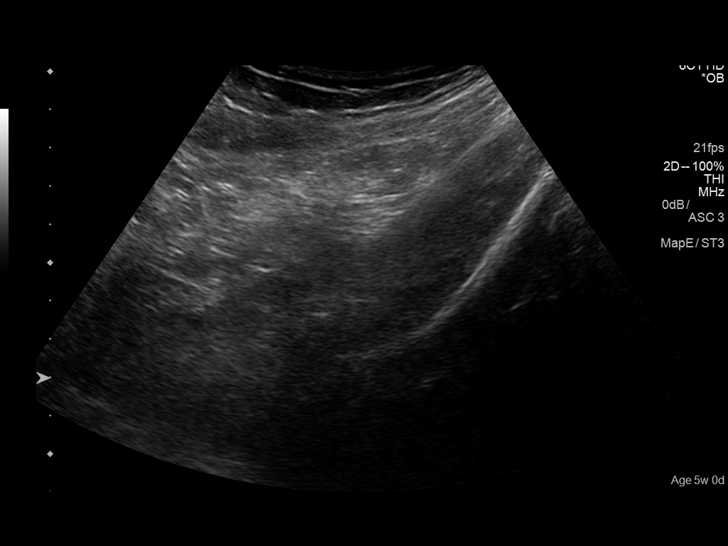
[im 40/82]
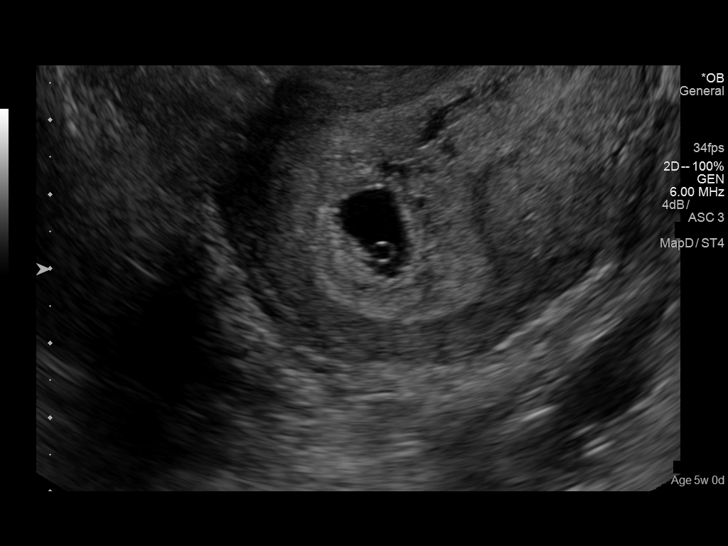
[im 46/82]
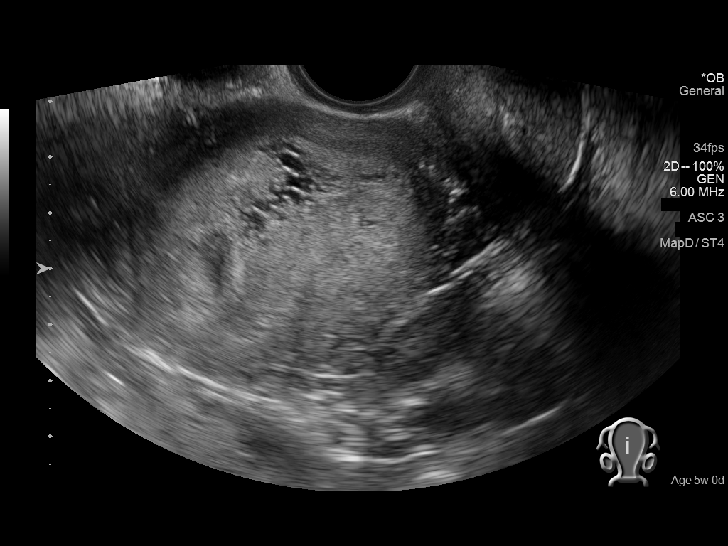
[im 52/82]
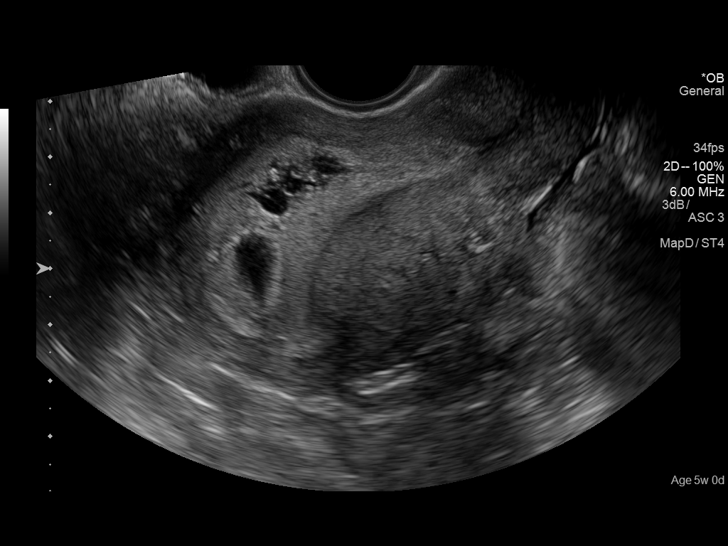
[im 58/82]
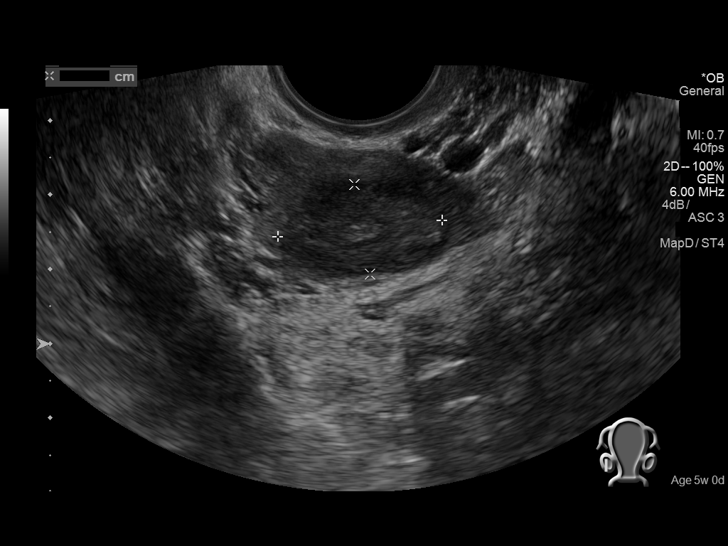
[im 64/82]
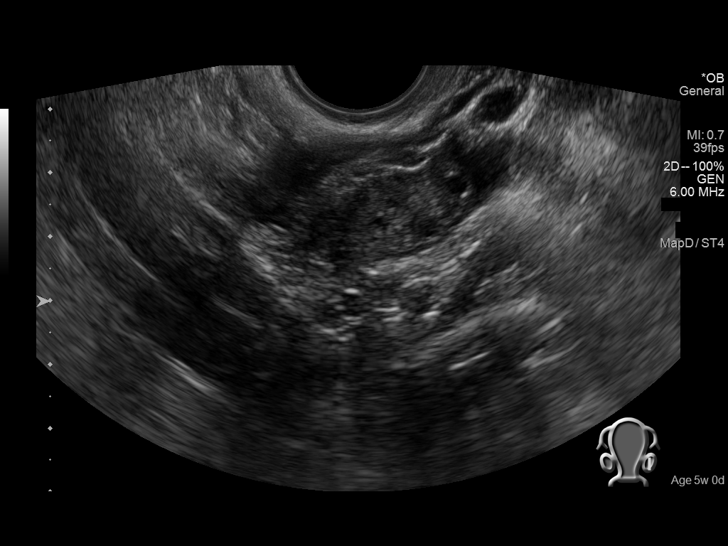
[im 70/82]
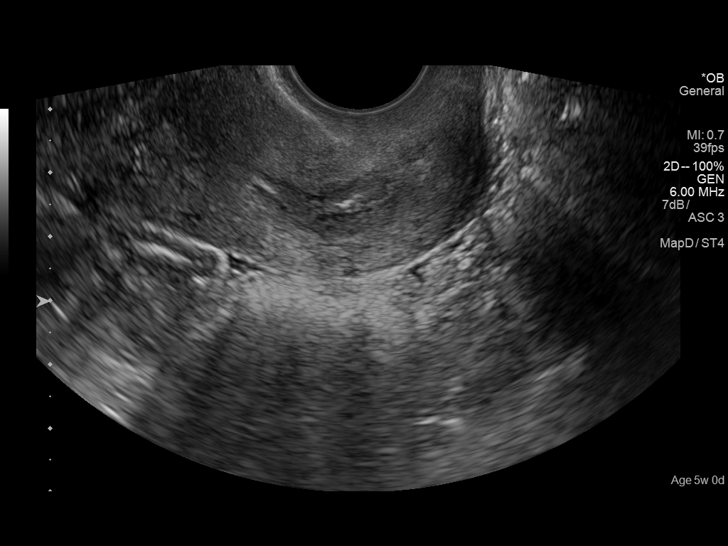
[im 76/82]
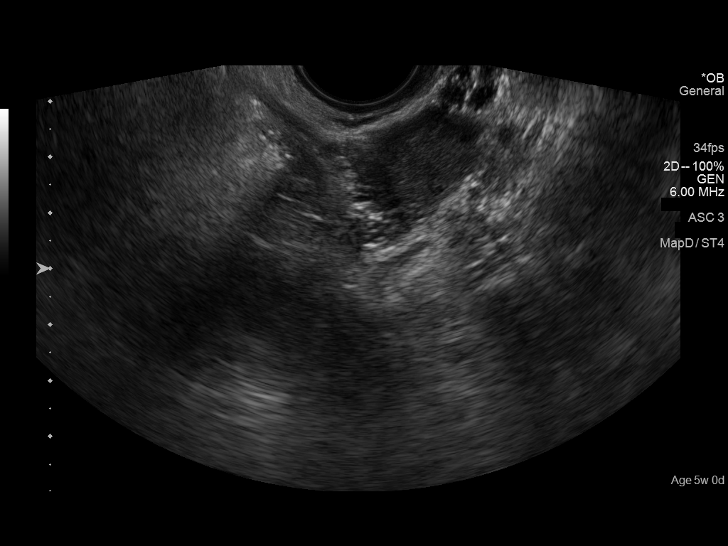
[im 82/82]
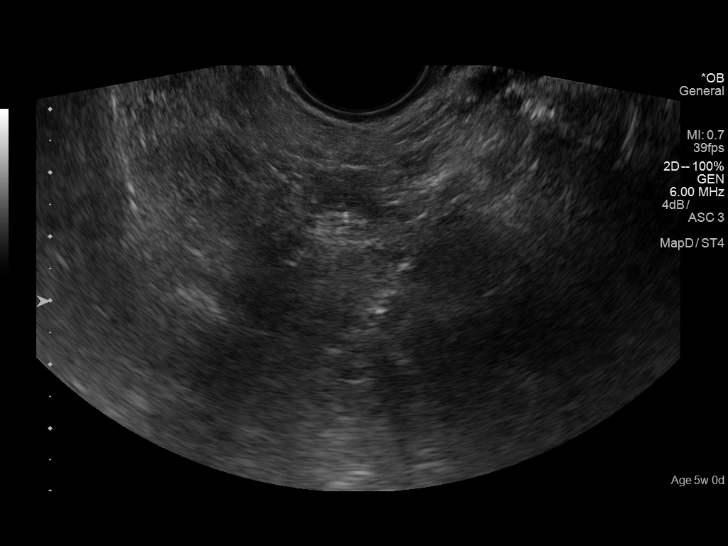

[14 of 28 positions shown; findings below may reference images not displayed]

FINDINGS: Intrauterine gestational sac: Single

Yolk sac:  Visualized.

Embryo:  Not Visualized.

MSD: 10.3 mm   5 w   5 d

CRL:    mm    w    d                  US EDC:

Subchorionic hemorrhage: There is a moderate subchorionic hemorrhage
extending inferior to the gestational sac.

Maternal uterus/adnexae: There is a corpus luteum cyst in the right
ovary. The ovaries are otherwise normal. Trace fluid in the
cul-de-sac is likely physiologic.
IMPRESSION: 1. There is a single IUP with a gestational sac and a yolk sac.
There is a moderate subchorionic hemorrhage extending inferior to
the gestational sac. No other abnormalities identified.

## 2020-01-13 ENCOUNTER — Other Ambulatory Visit: Payer: Self-pay

## 2020-01-13 ENCOUNTER — Emergency Department (HOSPITAL_BASED_OUTPATIENT_CLINIC_OR_DEPARTMENT_OTHER)
Admission: EM | Admit: 2020-01-13 | Discharge: 2020-01-13 | Disposition: A | Discharge status: Home / Self Care | Payer: Medicaid Other | Attending: Emergency Medicine | Admitting: Emergency Medicine

## 2020-01-13 ENCOUNTER — Encounter (HOSPITAL_BASED_OUTPATIENT_CLINIC_OR_DEPARTMENT_OTHER): Payer: Self-pay | Admitting: *Deleted

## 2020-01-13 DIAGNOSIS — Z3A29 29 weeks gestation of pregnancy: Secondary | ICD-10-CM | POA: Diagnosis not present

## 2020-01-13 DIAGNOSIS — R1033 Periumbilical pain: Secondary | ICD-10-CM | POA: Diagnosis not present

## 2020-01-13 DIAGNOSIS — Z20822 Contact with and (suspected) exposure to covid-19: Secondary | ICD-10-CM | POA: Insufficient documentation

## 2020-01-13 DIAGNOSIS — O26893 Other specified pregnancy related conditions, third trimester: Secondary | ICD-10-CM | POA: Insufficient documentation

## 2020-01-13 LAB — CBC
HCT: 42.3 % (ref 36.0–46.0)
Hemoglobin: 13.6 g/dL (ref 12.0–15.0)
MCH: 28.4 pg (ref 26.0–34.0)
MCHC: 32.2 g/dL (ref 30.0–36.0)
MCV: 88.3 fL (ref 80.0–100.0)
Platelets: 277 10*3/uL (ref 150–400)
RBC: 4.79 MIL/uL (ref 3.87–5.11)
RDW: 14.4 % (ref 11.5–15.5)
WBC: 8.6 10*3/uL (ref 4.0–10.5)
nRBC: 0 % (ref 0.0–0.2)

## 2020-01-13 LAB — BASIC METABOLIC PANEL
Anion gap: 10 (ref 5–15)
BUN: 6 mg/dL (ref 6–20)
CO2: 22 mmol/L (ref 22–32)
Calcium: 8.3 mg/dL — ABNORMAL LOW (ref 8.9–10.3)
Chloride: 103 mmol/L (ref 98–111)
Creatinine, Ser: 0.5 mg/dL (ref 0.44–1.00)
GFR calc Af Amer: >60 mL/min (ref >60)
GFR calc non Af Amer: >60 mL/min (ref >60)
Glucose, Bld: 88 mg/dL (ref 70–99)
Potassium: 3.4 mmol/L — ABNORMAL LOW (ref 3.5–5.1)
Sodium: 135 mmol/L (ref 135–145)

## 2020-01-13 LAB — RESP PANEL BY RT PCR (RSV, FLU A&B, COVID)
Influenza A by PCR: NEGATIVE
Influenza B by PCR: NEGATIVE
Respiratory Syncytial Virus by PCR: NEGATIVE
SARS Coronavirus 2 by RT PCR: NEGATIVE

## 2020-01-13 MED ORDER — ONDANSETRON 4 MG PO TBDP: 4.0000 mg | ORAL_TABLET | Freq: Three times a day (TID) | ORAL | 0 refills | Status: AC | PRN

## 2020-01-13 NOTE — Progress Notes (Signed)
1907: OBRRN called by [facility] about pt 63.1GA G2P1 with initial c/o SOB and diarrhea. Covid negative, while there pt developed abdominal pain and nausea. Abdominal pain consistent, positive fetal movement, no LOF, no vaginal bleeding.   1936: Dr. Shawnie Pons called and notified of pt G2P1 29.1 GA receives care at [facility]. Came to ED with initial c/o SOB and diarrhea. Covid negative, while there pt developed abdominal pain and nausea. Abdominal pain consistent, positive fetal movement, no LOF, no vaginal bleeding. VSS. FHR 135bpm w/ accels, no decels present, no ctx on monitor. Cord abnormality and fetal anomoly this pregnancy. No other OB complaint. Pt OB cleared at this time.   1942: [facility]RN called [facility] RN and notified of OB clearance. Discharge from monitor at this time.

## 2020-01-13 NOTE — ED Notes (Signed)
States her feeling of SOB has improved but her diarrhea is worse than earlier today.

## 2020-01-13 NOTE — ED Provider Notes (Signed)
[facility] EMERGENCY DEPARTMENT Provider Note   CSN: [id number] Arrival date & time: 01/13/20  1344     History Chief Complaint  Patient presents with  . Shortness of Breath    Michelle Cortez is a 29 y.o. female.  Patient [redacted] weeks pregnant.  Now lives in the [place] area.  Used to live here she recently moved there.  However she did have a follow-up maternal-fetal appointment this morning.  At that time she was having some diarrhea but was not really having any significant abdominal pain.  The diarrhea is really tapered off.  And around 1:00 when she was driving on her way back to [place] developed sharp shooting pains around the umbilicus area.  This got her concerned.  Little bit of a panic attack felt a little bit short of breath.  That is all resolved.  No blood in the bowel movements.  Denies any discharge.  No dysuria.  No fevers.  Oxygen saturations are good here she is in the upper 90s on room air.  Blood pressure here was 117/71.  Heart rate 91.  Respirations 17.        History reviewed. No pertinent past medical history.  There are no problems to display for this patient.   History reviewed. No pertinent surgical history.   OB History    Gravida  1   Para      Term      Preterm      AB      Living        SAB      TAB      Ectopic      Multiple      Live Births              No family history on file.  Social History   Tobacco Use  . Smoking status: Never Smoker  . Smokeless tobacco: Never Used  Vaping Use  . Vaping Use: Never used  Substance Use Topics  . Alcohol use: No  . Drug use: No    Home Medications Prior to Admission medications   Medication Sig Start Date End Date Taking? Authorizing Provider  Prenatal Vit-Fe Fumarate-FA (PRENATAL MULTIVITAMIN) TABS tablet Take 1 tablet by mouth daily at 12 noon.   Yes [provider]  acetaminophen (TYLENOL) 325 MG tablet Take 975 mg by mouth every 6 (six) hours as needed  for moderate pain.     [provider]  carbamide peroxide (DEBROX) 6.5 % OTIC solution Place 5 drops into both ears 2 (two) times daily. 11/23/17   Fayrene Helper, PA-C  naproxen (NAPROSYN) 375 MG tablet Take 1 tablet (375 mg total) by mouth 2 (two) times daily. Take 2 times daily 3 days before menses begins, and 2 days into your menses. 07/27/17   Aviva Kluver B, PA-C    Allergies    Zithromax [azithromycin]  Review of Systems   Review of Systems  Constitutional: Negative for chills and fever.  HENT: Negative for congestion, rhinorrhea and sore throat.   Eyes: Negative for visual disturbance.  Respiratory: Positive for shortness of breath. Negative for cough.   Cardiovascular: Negative for chest pain and leg swelling.  Gastrointestinal: Positive for abdominal pain and diarrhea. Negative for nausea and vomiting.  Genitourinary: Negative for dysuria.  Musculoskeletal: Negative for back pain and neck pain.  Skin: Negative for rash.  Neurological: Negative for dizziness, light-headedness and headaches.  Hematological: Does not bruise/bleed easily.  Psychiatric/Behavioral: Negative for  confusion.    Physical Exam Updated Vital Signs BP 117/71   Pulse 91   Temp 98.4 F (36.9 C) (Oral)   Resp 17   Ht 1.651 m (5\' 5" )   Wt 83 kg   SpO2 98%   BMI 30.45 kg/m   Physical Exam Vitals and nursing note reviewed.  Constitutional:      General: She is not in acute distress.    Appearance: Normal appearance. She is well-developed.  HENT:     Head: Normocephalic and atraumatic.  Eyes:     Extraocular Movements: Extraocular movements intact.     Conjunctiva/sclera: Conjunctivae normal.     Pupils: Pupils are equal, round, and reactive to light.  Cardiovascular:     Rate and Rhythm: Normal rate and regular rhythm.     Heart sounds: No murmur heard.   Pulmonary:     Effort: Pulmonary effort is normal. No respiratory distress.     Breath sounds: Normal breath sounds. No wheezing  or rales.  Abdominal:     Palpations: Abdomen is soft.     Tenderness: There is no abdominal tenderness.     Comments: Gravid abdomen.  No feeling of any contractions.  Nontender.  Approximately 30 weeks of size.  Musculoskeletal:        General: No swelling.     Cervical back: Normal range of motion and neck supple.  Skin:    General: Skin is warm and dry.     Capillary Refill: Capillary refill takes less than 2 seconds.  Neurological:     General: No focal deficit present.     Mental Status: She is alert and oriented to person, place, and time.     Cranial Nerves: No cranial nerve deficit.     Sensory: No sensory deficit.     ED Results / Procedures / Treatments   Labs (all labs ordered are listed, but only abnormal results are displayed) Labs Reviewed  RESP PANEL BY RT PCR (RSV, FLU A&B, COVID)    EKG None  Radiology No results found.  Procedures Procedures (including critical care time)  Medications Ordered in ED Medications - No data to display  ED Course  I have reviewed the triage vital signs and the nursing notes.  Pertinent labs & imaging results that were available during my care of the patient were reviewed by me and considered in my medical decision making (see chart for details).    MDM Rules/Calculators/A&P                          Due to the fact that she is in her third trimester.  We contacted the rapid response OB nurse.  Patient placed on monitoring.  She was monitored and cleared by them for discharge home.  No evidence of labor.  Patient's Covid testing was negative.  Oxygen saturations have been fine here.  No further significant diarrhea.  Patient did have development of some nausea.  Her daughter has had a similar illness so most likely this is a viral type illness.  Prescription for Zofran provided.  Patient will follow back up with her her new OB/GYN office in the [facility] area tomorrow.     Final Clinical Impression(s) / ED Diagnoses Final  diagnoses:  None    Rx / DC Orders ED Discharge Orders    None       [facility], MD 01/13/20 2014

## 2020-01-13 NOTE — ED Notes (Signed)
Pt on fetal monitor.  Spoke with Toni Amend at [facility]. 29 weeks, EDD 03/29/20, LMP 06/23/19  G2P1, normal 1st pregnancy, normal vaginal delivery. Only issue with this preg pt reports is a "cord issue". Pt arrived with sob,  Diarrhea this morning, which has resolved. Reports acute abdominal pain.

## 2020-01-13 NOTE — ED Notes (Signed)
I spoke to Dr Charm Barges regarding pregnancy monitoring. Since she has no pregnancy complaints he feels she will not need to be on the fetal monitor. If condition changes I will place pt on a fetal monitor.

## 2020-01-13 NOTE — ED Notes (Signed)
Per EDP Zackowski, pt needs to be moved to 14 to be monitored.

## 2020-01-13 NOTE — ED Triage Notes (Signed)
Sob for an hour. She is [redacted] weeks pregnant. She had covid 10 weeks ago. She had her first vaccine 2 weeks ago. No pain. No vaginal discharge or leakage. She feels the baby moving.

## 2020-01-13 NOTE — Discharge Instructions (Signed)
Follow-up with your OB in [place].  Cleared by our rapid response OB nurse after fetal monitoring.  Everything appears okay.  Covid testing was negative.  Most likely the diarrhea had early this morning probably is related to a similar illness that her daughter has.  Prescription for some antinausea medicine provided.

## 2020-01-13 NOTE — ED Notes (Signed)
Received call from [facility] stating pt looks good on monitor and is cleared by OB. Dr. Deretha Emory made aware.
